# Patient Record
Sex: Male | Born: 2000 | Race: Black or African American | Hispanic: No | Marital: Single | Smoking: Never smoker
Health system: Southern US, Community
[De-identification: ages and names within clinical notes are randomized; demographics above are authoritative.]

## PROBLEM LIST (undated history)

## (undated) DIAGNOSIS — R569 Unspecified convulsions: Secondary | ICD-10-CM

---
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
## 2018-03-08 ENCOUNTER — Emergency Department (HOSPITAL_COMMUNITY)
Admission: EM | Admit: 2018-03-08 | Discharge: 2018-03-08 | Disposition: A | Discharge status: Home / Self Care | Payer: Medicaid Other | Attending: Emergency Medicine | Admitting: Emergency Medicine

## 2018-03-08 ENCOUNTER — Other Ambulatory Visit: Payer: Self-pay

## 2018-03-08 ENCOUNTER — Encounter (HOSPITAL_COMMUNITY): Payer: Self-pay

## 2018-03-08 DIAGNOSIS — G43009 Migraine without aura, not intractable, without status migrainosus: Secondary | ICD-10-CM

## 2018-03-08 MED ORDER — SUMATRIPTAN SUCCINATE 25 MG PO TABS: 25.0000 mg | ORAL_TABLET | ORAL | 0 refills | Status: AC | PRN

## 2018-03-08 MED ORDER — IBUPROFEN 800 MG PO TABS
800.0000 mg | ORAL_TABLET | Freq: Once | ORAL | Status: AC
  Administered 2018-03-08: 800 mg via ORAL
  Filled 2018-03-08: qty 1

## 2018-03-08 MED ORDER — METOCLOPRAMIDE HCL 5 MG/ML IJ SOLN
10.0000 mg | Freq: Once | INTRAMUSCULAR | Status: AC
  Administered 2018-03-08: 10 mg via INTRAVENOUS
  Filled 2018-03-08: qty 2

## 2018-03-08 MED ORDER — SODIUM CHLORIDE 0.9 % IV BOLUS
1000.0000 mL | Freq: Once | INTRAVENOUS | Status: AC
  Administered 2018-03-08: 1000 mL via INTRAVENOUS

## 2018-03-08 MED ORDER — DIPHENHYDRAMINE HCL 50 MG/ML IJ SOLN
25.0000 mg | Freq: Once | INTRAMUSCULAR | Status: AC
  Administered 2018-03-08: 25 mg via INTRAVENOUS
  Filled 2018-03-08: qty 1

## 2018-03-08 NOTE — ED Provider Notes (Signed)
[facility]-EMERGENCY DEPT Provider Note  CSN: [id number] Arrival date & time: 03/08/18  1739  History   Chief Complaint Chief Complaint  Patient presents with  . Headache  . Nausea    HPI Casey Kane is a 17 y.o. male with no significant medical history who presented to the ED for headache x2 days. He describes constant, pounding pain that he feels all over his head. Associated symptoms: blurred vision, photophobia and phonophobia. Denies fever, neck pain/stiffness, AMS, facial droop, slurred speech or weakness. Denies recent head trauma, falls or injuries. Patient reports that his mother has a history of migraine headaches. Patient has tried ibuprofen prior to coming to the ED.  History reviewed. No pertinent past medical history.  There are no active problems to display for this patient.   History reviewed. No pertinent surgical history.      Home Medications    Prior to Admission medications   Medication Sig Start Date End Date Taking? Authorizing Provider  doxycycline (VIBRAMYCIN) 100 MG capsule Take 1 capsule (100 mg total) by mouth 2 (two) times daily. One po bid x 7 days Patient not taking: Reported on 03/08/2018 11/27/17   Fayrene Helper, PA-C  ibuprofen (ADVIL,MOTRIN) 600 MG tablet Take 1 tablet (600 mg total) by mouth every 6 (six) hours as needed. Patient not taking: Reported on 03/08/2018 11/27/17   Fayrene Helper, PA-C  naproxen (NAPROSYN) 500 MG tablet Take 1 tablet (500 mg total) by mouth 2 (two) times daily as needed. Patient not taking: Reported on 03/08/2018 11/30/17   Alvino Chapel, [place], PA-C  SUMAtriptan (IMITREX) 25 MG tablet Take 1 tablet (25 mg total) by mouth every 2 (two) hours as needed for migraine. May repeat in 2 hours if headache persists or recurs. 03/08/18   Yareth Kearse, Sharyon Medicus, PA-C    Family History History reviewed. No pertinent family history.  Social History Social History   Tobacco Use  . Smoking status: Never Smoker  . Smokeless  tobacco: Never Used  Substance Use Topics  . Alcohol use: Never    Frequency: Never  . Drug use: Never     Allergies   Patient has no known allergies.   Review of Systems Review of Systems  Constitutional: Negative for chills and fever.  HENT: Negative.   Eyes: Positive for photophobia and visual disturbance.  Gastrointestinal: Negative for nausea and vomiting.  Musculoskeletal: Negative for neck pain and neck stiffness.  Neurological: Positive for dizziness and headaches. Negative for facial asymmetry, speech difficulty, weakness, light-headedness and numbness.  Psychiatric/Behavioral: Negative for confusion and decreased concentration.   Physical Exam Updated Vital Signs BP 134/65 (BP Location: Left Arm)   Pulse 68   Temp 98.3 F (36.8 C) (Oral)   Resp 14   SpO2 97%   Physical Exam  Constitutional: He is oriented to person, place, and time. Vital signs are normal. He appears well-developed and well-nourished. He is cooperative.  HENT:  Head: Normocephalic and atraumatic.  Eyes: Pupils are equal, round, and reactive to light. EOM are normal.  Neck: Full passive range of motion without pain. Neck supple. No spinous process tenderness and no muscular tenderness present. Normal range of motion present.  Cardiovascular: Normal rate, regular rhythm, normal heart sounds, intact distal pulses and normal pulses.  No murmur heard. Pulmonary/Chest: Effort normal and breath sounds normal.  Musculoskeletal: Normal range of motion.  Neurological: He is alert and oriented to person, place, and time. He has normal strength. No cranial nerve deficit or sensory deficit.  He exhibits normal muscle tone.  Psychiatric: He has a normal mood and affect. His speech is normal and behavior is normal. Thought content normal. He is attentive.  Nursing note and vitals reviewed.  ED Treatments / Results  Labs (all labs ordered are listed, but only abnormal results are displayed) Labs Reviewed - No  data to display  EKG None  Radiology No results found.  Procedures Procedures (including critical care time)  Medications Ordered in ED Medications  sodium chloride 0.9 % bolus 1,000 mL (1,000 mLs Intravenous New Bag/Given 03/08/18 2102)  diphenhydrAMINE (BENADRYL) injection 25 mg (25 mg Intravenous Given 03/08/18 2101)  metoCLOPramide (REGLAN) injection 10 mg (10 mg Intravenous Given 03/08/18 2101)  ibuprofen (ADVIL,MOTRIN) tablet 800 mg (800 mg Oral Given 03/08/18 2054)    Initial Impression / Assessment and Plan / ED Course  Triage vital signs and the nursing notes have been reviewed.  Pertinent labs & imaging results that were available during care of the patient were reviewed and considered in medical decision making (see chart for details).  Patient presents with a 2-day history of headache that is accompanied by blurred vision, photophobia, phonophobia and dizziness. Physical exam is reassuring as patient does not have any focal neuro deficits and is mentating well. Denies history of head trauma, syncope or AMS that would warrant head imaging. History consistent migraine pattern. Will initiate acute treatment with IV migraine cocktail.  Clinical Course as of Mar 08 2201  Wynelle Link Mar 08, 2018  2044 IV migraine cocktail of Reglan + Benadryl + NS given along with Ibuprofen 800mg  x1. Patient reports adverse reaction to Toradol in the past.   [GM]  2155 Patient re-evaluated and reports that headache has completely resolved.   [GM]    Clinical Course User Index [GM] Hadlea Furuya, Sharyon Medicus, PA-C   Final Clinical Impressions(s) / ED Diagnoses  1. Headache. Migraine type pattern. Relief achieved with migraine cocktail. Education provided on OTC and supportive treatment for migraines. Rx for Imitrex given if conservative and OTC treatments do not work. Advised to follow-up with PCP to discuss preventative medications if headaches become more frequent and/or severe.  Dispo: Home. After thorough  clinical evaluation, this patient is determined to be medically stable and can be safely discharged with the previously mentioned treatment and/or outpatient follow-up/referral(s). At this time, there are no other apparent medical conditions that require further screening, evaluation or treatment.  Final diagnoses:  Migraine without aura and without status migrainosus, not intractable    ED Discharge Orders         Ordered    SUMAtriptan (IMITREX) 25 MG tablet  Every 2 hours PRN     03/08/18 2150            Reva Bores 03/08/18 2202    Mancel Bale, MD 03/09/18 [phone]

## 2018-03-08 NOTE — ED Triage Notes (Signed)
Pt reports headache and nausea x 2-3 days. Reports that he also feels like his heart is racing and some slight SOB. A&Ox4. Ambulatory. PERRLA.

## 2018-03-08 NOTE — ED Notes (Signed)
Pt is alert and oriented x 4 and is verbally responsive. Pt is escorted by his girlfriends mother. Pt reports nausea, vomiting, and HA x 2-3 days.

## 2018-03-08 NOTE — ED Notes (Signed)
Patient ate sandwich and had water; patient tolerated well and does not feel nauseated at this time.

## 2018-03-08 NOTE — ED Notes (Signed)
Patient given sandwich.  

## 2018-03-08 NOTE — Discharge Instructions (Addendum)
For future headaches like this, you can try Tylenol (650mg ) and/or Ibuprofen (800mg ).   If that does not work, you can try the Sumatriptan (Imitrex) which I have written you a prescription for. You take one dose of the Imitrex and if you still have a headache after 2 hours, you take another dose. So that is no more than 2 doses in 1 day.  Follow-up with your PCP if these headaches become more frequent (more than 3-4 days a week for 1-2 months) or severe to discuss the need for preventive medications.  Thank you for allowing me to take care of you today!

## 2019-02-22 ENCOUNTER — Emergency Department (HOSPITAL_COMMUNITY)
Admission: EM | Admit: 2019-02-22 | Discharge: 2019-02-23 | Disposition: A | Discharge status: Home / Self Care | Payer: Medicaid Other | Attending: Emergency Medicine | Admitting: Emergency Medicine

## 2019-02-22 ENCOUNTER — Other Ambulatory Visit: Payer: Self-pay

## 2019-02-22 ENCOUNTER — Emergency Department (HOSPITAL_COMMUNITY): Payer: Medicaid Other

## 2019-02-22 ENCOUNTER — Encounter (HOSPITAL_COMMUNITY): Payer: Self-pay | Admitting: Emergency Medicine

## 2019-02-22 DIAGNOSIS — R509 Fever, unspecified: Secondary | ICD-10-CM | POA: Diagnosis not present

## 2019-02-22 DIAGNOSIS — R000 Tachycardia, unspecified: Secondary | ICD-10-CM | POA: Diagnosis not present

## 2019-02-22 DIAGNOSIS — M25462 Effusion, left knee: Secondary | ICD-10-CM | POA: Insufficient documentation

## 2019-02-22 DIAGNOSIS — M25562 Pain in left knee: Secondary | ICD-10-CM

## 2019-02-22 NOTE — ED Notes (Signed)
Pt ambulatory with unsteady gait due to pain. Pt provided with wheel chair to room 19 and to x-ray

## 2019-02-22 NOTE — ED Triage Notes (Signed)
Patient here from home with complaints of left knee pain x3 day. Denies injury. Increased with movement.

## 2019-02-23 ENCOUNTER — Other Ambulatory Visit: Payer: Self-pay

## 2019-02-23 LAB — CBC WITH DIFFERENTIAL/PLATELET
Abs Immature Granulocytes: 0.03 10*3/uL (ref 0.00–0.07)
Basophils Absolute: 0 10*3/uL (ref 0.0–0.1)
Basophils Relative: 0 %
Eosinophils Absolute: 0.1 10*3/uL (ref 0.0–0.5)
Eosinophils Relative: 1 %
HCT: 49.9 % (ref 39.0–52.0)
Hemoglobin: 16 g/dL (ref 13.0–17.0)
Immature Granulocytes: 0 %
Lymphocytes Relative: 29 %
Lymphs Abs: 2.4 10*3/uL (ref 0.7–4.0)
MCH: 27 pg (ref 26.0–34.0)
MCHC: 32.1 g/dL (ref 30.0–36.0)
MCV: 84.1 fL (ref 80.0–100.0)
Monocytes Absolute: 0.5 10*3/uL (ref 0.1–1.0)
Monocytes Relative: 5 %
Neutro Abs: 5.3 10*3/uL (ref 1.7–7.7)
Neutrophils Relative %: 65 %
Platelets: 222 10*3/uL (ref 150–400)
RBC: 5.93 MIL/uL — ABNORMAL HIGH (ref 4.22–5.81)
RDW: 13.1 % (ref 11.5–15.5)
WBC: 8.3 10*3/uL (ref 4.0–10.5)
nRBC: 0 % (ref 0.0–0.2)

## 2019-02-23 LAB — COMPREHENSIVE METABOLIC PANEL
ALT: 25 U/L (ref 0–44)
AST: 35 U/L (ref 15–41)
Albumin: 4.6 g/dL (ref 3.5–5.0)
Alkaline Phosphatase: 55 U/L (ref 38–126)
Anion gap: 15 (ref 5–15)
BUN: 9 mg/dL (ref 6–20)
CO2: 20 mmol/L — ABNORMAL LOW (ref 22–32)
Calcium: 9.6 mg/dL (ref 8.9–10.3)
Chloride: 103 mmol/L (ref 98–111)
Creatinine, Ser: 1.25 mg/dL — ABNORMAL HIGH (ref 0.61–1.24)
GFR calc Af Amer: >60 mL/min (ref >60)
GFR calc non Af Amer: >60 mL/min (ref >60)
Glucose, Bld: 87 mg/dL (ref 70–99)
Potassium: 4.7 mmol/L (ref 3.5–5.1)
Sodium: 138 mmol/L (ref 135–145)
Total Bilirubin: 0.9 mg/dL (ref 0.3–1.2)
Total Protein: 8 g/dL (ref 6.5–8.1)

## 2019-02-23 LAB — URINALYSIS, ROUTINE W REFLEX MICROSCOPIC
Bilirubin Urine: NEGATIVE
Glucose, UA: NEGATIVE mg/dL
Hgb urine dipstick: NEGATIVE
Ketones, ur: 5 mg/dL — AB
Leukocytes,Ua: NEGATIVE
Nitrite: NEGATIVE
Protein, ur: NEGATIVE mg/dL
Specific Gravity, Urine: 1.006 (ref 1.005–1.030)
pH: 6 (ref 5.0–8.0)

## 2019-02-23 LAB — TSH: TSH: 2.502 u[IU]/mL (ref 0.350–4.500)

## 2019-02-23 LAB — C-REACTIVE PROTEIN: CRP: 1.3 mg/dL — ABNORMAL HIGH (ref <1.0)

## 2019-02-23 LAB — SEDIMENTATION RATE: Sed Rate: 0 mm/hr (ref 0–16)

## 2019-02-23 MED ORDER — DIAZEPAM 5 MG PO TABS
5.0000 mg | ORAL_TABLET | Freq: Once | ORAL | Status: AC
  Administered 2019-02-23: 5 mg via ORAL
  Filled 2019-02-23: qty 1

## 2019-02-23 MED ORDER — SODIUM CHLORIDE 0.9 % IV BOLUS
1000.0000 mL | Freq: Once | INTRAVENOUS | Status: AC
Start: 2019-02-23 — End: 2019-02-23
  Administered 2019-02-23: 02:00:00 1000 mL via INTRAVENOUS

## 2019-02-23 MED ORDER — LIDOCAINE-EPINEPHRINE (PF) 2 %-1:200000 IJ SOLN
10.0000 mL | Freq: Once | INTRAMUSCULAR | Status: AC
  Administered 2019-02-23: 10 mL via INTRADERMAL
  Filled 2019-02-23: qty 10

## 2019-02-23 MED ORDER — ACETAMINOPHEN 500 MG PO TABS
1000.0000 mg | ORAL_TABLET | Freq: Once | ORAL | Status: AC
  Administered 2019-02-23: 1000 mg via ORAL
  Filled 2019-02-23: qty 2

## 2019-02-23 MED ORDER — OXYCODONE HCL 5 MG PO TABS
5.0000 mg | ORAL_TABLET | Freq: Once | ORAL | Status: AC
  Administered 2019-02-23: 5 mg via ORAL
  Filled 2019-02-23: qty 1

## 2019-02-23 NOTE — Discharge Instructions (Signed)
As we discussed please return for worsening pain or redness and swelling to the knee.  Please try and keep your weight off of your leg for the next week.  Take 4 over the counter ibuprofen tablets 3 times a day or 2 over-the-counter naproxen tablets twice a day for pain. Also take tylenol 1000mg (2 extra strength) four times a day.

## 2019-02-23 NOTE — ED Provider Notes (Signed)
[facility] DEPT Provider Note   CSN: [id number] Arrival date & time: 02/22/19  2300    History   Chief Complaint Chief Complaint  Patient presents with  . Knee Pain    HPI Nosson Wender is a 18 y.o. male.     18 yo M with a chief complaint of left knee pain.  Going on for about 2 or 3 days.  No trauma.  No fevers or chills.  No cough or congestion no abdominal pain.  No urinary symptoms.  Denies IV drug abuse.  Denies wounds to the legs.  Pain is worse with sitting.  Some pain with ambulation.  The history is provided by the patient.  Knee Pain Location:  Knee Time since incident:  2 days Injury: no   Knee location:  L knee Pain details:    Quality:  Aching   Radiates to:  Does not radiate   Severity:  Moderate   Onset quality:  Gradual   Duration:  2 days   Timing:  Constant   Progression:  Worsening Chronicity:  New Prior injury to area:  No Relieved by:  Nothing Worsened by:  Nothing Ineffective treatments:  None tried Associated symptoms: no fever     History reviewed. No pertinent past medical history.  There are no active problems to display for this patient.   History reviewed. No pertinent surgical history.      Home Medications    Prior to Admission medications   Medication Sig Start Date End Date Taking? Authorizing Provider  doxycycline (VIBRAMYCIN) 100 MG capsule Take 1 capsule (100 mg total) by mouth 2 (two) times daily. One po bid x 7 days Patient not taking: Reported on 03/08/2018 11/27/17   Domenic Moras, PA-C  ibuprofen (ADVIL,MOTRIN) 600 MG tablet Take 1 tablet (600 mg total) by mouth every 6 (six) hours as needed. Patient not taking: Reported on 03/08/2018 11/27/17   Domenic Moras, PA-C  naproxen (NAPROSYN) 500 MG tablet Take 1 tablet (500 mg total) by mouth 2 (two) times daily as needed. Patient not taking: Reported on 03/08/2018 11/30/17   Stacey Drain, [place], PA-C  SUMAtriptan (IMITREX) 25 MG tablet Take 1 tablet (25  mg total) by mouth every 2 (two) hours as needed for migraine. May repeat in 2 hours if headache persists or recurs. Patient not taking: Reported on 02/23/2019 03/08/18   Romie Jumper, PA-C    Family History No family history on file.  Social History Social History   Tobacco Use  . Smoking status: Never Smoker  . Smokeless tobacco: Never Used  Substance Use Topics  . Alcohol use: Never    Frequency: Never  . Drug use: Never     Allergies   Patient has no known allergies.   Review of Systems Review of Systems  Constitutional: Negative for chills and fever.  HENT: Negative for congestion and facial swelling.   Eyes: Negative for discharge and visual disturbance.  Respiratory: Negative for shortness of breath.   Cardiovascular: Negative for chest pain and palpitations.  Gastrointestinal: Negative for abdominal pain, diarrhea and vomiting.  Musculoskeletal: Positive for arthralgias. Negative for myalgias.  Skin: Negative for color change and rash.  Neurological: Negative for tremors, syncope and headaches.  Psychiatric/Behavioral: Negative for confusion and dysphoric mood.     Physical Exam Updated Vital Signs BP (!) 127/56   Pulse 96   Temp 98.4 F (36.9 C) (Oral)   Resp 16   SpO2 93%   Physical Exam Vitals  signs and nursing note reviewed.  Constitutional:      Appearance: He is well-developed.  HENT:     Head: Normocephalic and atraumatic.  Eyes:     Pupils: Pupils are equal, round, and reactive to light.  Neck:     Musculoskeletal: Normal range of motion and neck supple.     Vascular: No JVD.  Cardiovascular:     Rate and Rhythm: Regular rhythm. Tachycardia present.     Heart sounds: No murmur. No friction rub. No gallop.   Pulmonary:     Effort: No respiratory distress.     Breath sounds: No wheezing.  Abdominal:     General: There is no distension.     Tenderness: There is no guarding or rebound.  Musculoskeletal: Normal range of motion.         General: Swelling present.     Comments: Very mild edema compared to the right knee.  Full range of motion without significant tenderness.  Skin:    Coloration: Skin is not pale.     Findings: No rash.  Neurological:     Mental Status: He is alert and oriented to person, place, and time.  Psychiatric:        Behavior: Behavior normal.      ED Treatments / Results  Labs (all labs ordered are listed, but only abnormal results are displayed) Labs Reviewed  COMPREHENSIVE METABOLIC PANEL - Abnormal; Notable for the following components:      Result Value   CO2 20 (*)    Creatinine, Ser 1.25 (*)    All other components within normal limits  C-REACTIVE PROTEIN - Abnormal; Notable for the following components:   CRP 1.3 (*)    All other components within normal limits  URINALYSIS, ROUTINE W REFLEX MICROSCOPIC - Abnormal; Notable for the following components:   Color, Urine STRAW (*)    Ketones, ur 5 (*)    All other components within normal limits  CBC WITH DIFFERENTIAL/PLATELET - Abnormal; Notable for the following components:   RBC 5.93 (*)    All other components within normal limits  SEDIMENTATION RATE  CBC WITH DIFFERENTIAL/PLATELET  TSH  T4  GC/CHLAMYDIA PROBE AMP ([place]) NOT AT [facility]    EKG None  Radiology Dg Knee Complete 4 Views Left  Result Date: 02/23/2019 CLINICAL DATA:  Knee pain EXAM: LEFT KNEE - COMPLETE 4+ VIEW COMPARISON:  None. FINDINGS: No evidence of fracture, dislocation, or joint effusion. No evidence of arthropathy or other focal bone abnormality. There is some mild fragmentation of the tibial tubercle which is likely chronic. There appears to be some adjacent soft tissue swelling. IMPRESSION: 1. No acute displaced fracture or dislocation. 2. Mild soft tissue swelling about the tibial tubercle may represent sequela of Osgood-Schlatter disease, however the patient is slightly older than expected for this process. Electronically Signed   By: Katherine Mantlehristopher   Green M.D.   On: 02/23/2019 00:00    Procedures .Joint Aspiration/Arthrocentesis  Date/Time: 02/23/2019 1:23 AM Performed by: Melene PlanFloyd, Kiel Cockerell, DO Authorized by: Melene PlanFloyd, Saori Umholtz, DO   Consent:    Consent obtained:  Verbal   Consent given by:  Patient   Risks discussed:  Bleeding, infection and incomplete drainage   Alternatives discussed:  No treatment, delayed treatment, alternative treatment and observation Location:    Location:  Knee   Knee:  L knee Anesthesia (see MAR for exact dosages):    Anesthesia method:  Local infiltration   Local anesthetic:  Lidocaine 2% WITH epi Procedure  details:    Preparation: Patient was prepped and draped in usual sterile fashion     Needle gauge: 21.   Ultrasound guidance: no     Approach:  Lateral   Aspirate amount:  1cc   Aspirate characteristics:  Clear   Steroid injected: no     Specimen collected: no   Post-procedure details:    Dressing:  Adhesive bandage   Patient tolerance of procedure:  Tolerated well, no immediate complications   (including critical care time)  Medications Ordered in ED Medications  sodium chloride 0.9 % bolus 1,000 mL (0 mLs Intravenous Stopped 02/23/19 0412)  lidocaine-EPINEPHrine (XYLOCAINE W/EPI) 2 %-1:200000 (PF) injection 10 mL (10 mLs Intradermal Given by Other 02/23/19 0059)  acetaminophen (TYLENOL) tablet 1,000 mg (1,000 mg Oral Given 02/23/19 0059)  oxyCODONE (Oxy IR/ROXICODONE) immediate release tablet 5 mg (5 mg Oral Given 02/23/19 0059)  diazepam (VALIUM) tablet 5 mg (5 mg Oral Given 02/23/19 0058)     Initial Impression / Assessment and Plan / ED Course  I have reviewed the triage vital signs and the nursing notes.  Pertinent labs & imaging results that were available during my care of the patient were reviewed by me and considered in my medical decision making (see chart for details).        18 yo M with a chief complaints of left knee pain.  Patient was found to nearly febrile here with a  temperature of 100.1.  His heart rate is going into the 150s pretty consistently.  He denies any infectious symptoms.  Only complaining of left knee pain.  The knee clinically is not consistent with septic arthritis.  He has full range of motion with very minimal tenderness.  With a borderline fever and significant tachycardia I must still consider it as a possible diagnosis.  Arthrocentesis however with a essentially dry tap.  I was able to get a couple drops of fluid.  Clear appearing.  I felt that this was reassuring and consistent with not being septic arthritis.  Patient's heart rate still going into the 130s.  Given pain medicine fluids will check lab work.  The patient's electrolytes are unremarkable.  Hemoglobin is normal.  Sed rate is normal.  His CRP is mildly elevated.  X-ray viewed by me without fracture.  I called the lab is the patient's thyroid test has not yet resulted.  They felt that it needed to be rerun in the system.  I discussed this with the patient who is like to be discharged at this time.  We will place him in a knee immobilizer.  PCP follow-up.  4:22 AM:  I have discussed the diagnosis/risks/treatment options with the patient and believe the pt to be eligible for discharge home to follow-up with PCP. We also discussed returning to the ED immediately if new or worsening sx occur. We discussed the sx which are most concerning (e.g., sudden worsening pain, fever, inability to tolerate by mouth) that necessitate immediate return. Medications administered to the patient during their visit and any new prescriptions provided to the patient are listed below.  Medications given during this visit Medications  sodium chloride 0.9 % bolus 1,000 mL (0 mLs Intravenous Stopped 02/23/19 0412)  lidocaine-EPINEPHrine (XYLOCAINE W/EPI) 2 %-1:200000 (PF) injection 10 mL (10 mLs Intradermal Given by Other 02/23/19 0059)  acetaminophen (TYLENOL) tablet 1,000 mg (1,000 mg Oral Given 02/23/19 0059)   oxyCODONE (Oxy IR/ROXICODONE) immediate release tablet 5 mg (5 mg Oral Given 02/23/19 0059)  diazepam (VALIUM)  tablet 5 mg (5 mg Oral Given 02/23/19 0058)     The patient appears reasonably screen and/or stabilized for discharge and I doubt any other medical condition or other [facility] requiring further screening, evaluation, or treatment in the ED at this time prior to discharge.   Final Clinical Impressions(s) / ED Diagnoses   Final diagnoses:  Acute pain of left knee  Sinus tachycardia    ED Discharge Orders    None       Melene PlanFloyd, Chianne Byrns, DO 02/23/19 0422

## 2019-02-24 LAB — T4: T4, Total: 6.3 ug/dL (ref 4.5–12.0)

## 2019-05-13 ENCOUNTER — Encounter (HOSPITAL_COMMUNITY): Payer: Self-pay

## 2019-05-13 ENCOUNTER — Other Ambulatory Visit: Payer: Self-pay

## 2019-05-13 ENCOUNTER — Emergency Department (HOSPITAL_COMMUNITY)
Admission: EM | Admit: 2019-05-13 | Discharge: 2019-05-13 | Disposition: A | Discharge status: Home / Self Care | Payer: Medicaid Other | Attending: Emergency Medicine | Admitting: Emergency Medicine

## 2019-05-13 DIAGNOSIS — R251 Tremor, unspecified: Secondary | ICD-10-CM | POA: Diagnosis present

## 2019-05-13 DIAGNOSIS — I10 Essential (primary) hypertension: Secondary | ICD-10-CM | POA: Insufficient documentation

## 2019-05-13 HISTORY — DX: Unspecified convulsions: R56.9

## 2019-05-13 LAB — BASIC METABOLIC PANEL
Anion gap: 9 (ref 5–15)
BUN: 13 mg/dL (ref 6–20)
CO2: 25 mmol/L (ref 22–32)
Calcium: 9.4 mg/dL (ref 8.9–10.3)
Chloride: 102 mmol/L (ref 98–111)
Creatinine, Ser: 1.19 mg/dL (ref 0.61–1.24)
GFR calc Af Amer: >60 mL/min (ref >60)
GFR calc non Af Amer: >60 mL/min (ref >60)
Glucose, Bld: 127 mg/dL — ABNORMAL HIGH (ref 70–99)
Potassium: 4 mmol/L (ref 3.5–5.1)
Sodium: 136 mmol/L (ref 135–145)

## 2019-05-13 LAB — CBC WITH DIFFERENTIAL/PLATELET
Abs Immature Granulocytes: 0.02 10*3/uL (ref 0.00–0.07)
Basophils Absolute: 0 10*3/uL (ref 0.0–0.1)
Basophils Relative: 0 %
Eosinophils Absolute: 0.1 10*3/uL (ref 0.0–0.5)
Eosinophils Relative: 1 %
HCT: 51.4 % (ref 39.0–52.0)
Hemoglobin: 16.2 g/dL (ref 13.0–17.0)
Immature Granulocytes: 0 %
Lymphocytes Relative: 30 %
Lymphs Abs: 2.2 10*3/uL (ref 0.7–4.0)
MCH: 26.6 pg (ref 26.0–34.0)
MCHC: 31.5 g/dL (ref 30.0–36.0)
MCV: 84.5 fL (ref 80.0–100.0)
Monocytes Absolute: 0.4 10*3/uL (ref 0.1–1.0)
Monocytes Relative: 6 %
Neutro Abs: 4.6 10*3/uL (ref 1.7–7.7)
Neutrophils Relative %: 63 %
Platelets: 197 10*3/uL (ref 150–400)
RBC: 6.08 MIL/uL — ABNORMAL HIGH (ref 4.22–5.81)
RDW: 13.4 % (ref 11.5–15.5)
WBC: 7.3 10*3/uL (ref 4.0–10.5)
nRBC: 0 % (ref 0.0–0.2)

## 2019-05-13 LAB — ETHANOL: Alcohol, Ethyl (B): <10 mg/dL (ref <10)

## 2019-05-13 NOTE — ED Triage Notes (Signed)
Pt BIB [facility] from home. Witness states that he was sitting on the cough and started shaking for 30 seconds. Patient states that he remembers the whole event and does not think that he had a seizure, but does have a recent hx of a seizure for which he was medicated for a short period and then the meds were d/c'd according to patient. A&Ox4 now.   20g L hand

## 2019-05-13 NOTE — Discharge Instructions (Addendum)
The testing today did not show any serious problems.  Your blood pressure is minimally elevated.  You may have sleep apnea, causing your breathing problems at nighttime.  It is unclear if you have had a seizure or not.  We are referring you to the neurology service to be evaluated for possible seizure disorder.  Make sure you are getting plenty of rest, avoid alcohol before sleep, try to exercise regularly and drink a lot of fluids, especially water.  Return here if needed.

## 2019-05-13 NOTE — ED Provider Notes (Signed)
[facility]-EMERGENCY DEPT Provider Note   CSN: [id number] Arrival date & time: 05/13/19  1943     History   Chief Complaint Chief Complaint  Patient presents with  . Seizures    HPI Casey Kane is a 18 y.o. male.     HPI   Patient was sleeping, watching TV, awoke and had 5-second episode of twitching arms and jaw.  This resolved spontaneously.  He has a history of event felt to be seizure about 5 years ago.  EMS was called they came to his home and found that his blood pressure was high at 170/100.  He was therefore transferred here for evaluation of these problems.  He did not work today, was at home with his partner, and they were doing things around the house.  She states that he has episodes of catching his breath, and waking up while sleeping, for long time and that he snores a lot.  She thinks that he has sleep apnea.  He has never been formally evaluated for seizure disorder.  He denies recent illnesses including fever, chills, vomiting, dizziness, change in bowel or urinary habits.  There are no other known modifying factors.  Past Medical History:  Diagnosis Date  . Seizures (HCC)     There are no active problems to display for this patient.   History reviewed. No pertinent surgical history.      Home Medications    Prior to Admission medications   Medication Sig Start Date End Date Taking? Authorizing Provider  acetaminophen (TYLENOL) 500 MG tablet Take 1,000 mg by mouth every 6 (six) hours as needed for mild pain.   Yes [provider]  doxycycline (VIBRAMYCIN) 100 MG capsule Take 1 capsule (100 mg total) by mouth 2 (two) times daily. One po bid x 7 days Patient not taking: Reported on 03/08/2018 11/27/17   Fayrene Helperran, [place], PA-C  ibuprofen (ADVIL,MOTRIN) 600 MG tablet Take 1 tablet (600 mg total) by mouth every 6 (six) hours as needed. Patient not taking: Reported on 03/08/2018 11/27/17   Fayrene Helperran, [place], PA-C  naproxen (NAPROSYN) 500 MG  tablet Take 1 tablet (500 mg total) by mouth 2 (two) times daily as needed. Patient not taking: Reported on 03/08/2018 11/30/17   Alvino ChapelWurst, [place], PA-C  SUMAtriptan (IMITREX) 25 MG tablet Take 1 tablet (25 mg total) by mouth every 2 (two) hours as needed for migraine. May repeat in 2 hours if headache persists or recurs. Patient not taking: Reported on 02/23/2019 03/08/18   Windy CarinaMortis, Gabrielle I, PA-C    Family History History reviewed. No pertinent family history.  Social History Social History   Tobacco Use  . Smoking status: Never Smoker  . Smokeless tobacco: Never Used  Substance Use Topics  . Alcohol use: Never    Frequency: Never  . Drug use: Never     Allergies   Patient has no known allergies.   Review of Systems Review of Systems  All other systems reviewed and are negative.    Physical Exam Updated Vital Signs BP 116/89 (BP Location: Left Arm)   Pulse 92   Temp 98.5 F (36.9 C) (Oral)   Resp 18   Ht 5\' 10"  (1.778 m)   Wt 104.3 kg   SpO2 93%   BMI 33.00 kg/m   Physical Exam Vitals signs and nursing note reviewed.  Constitutional:      General: He is not in acute distress.    Appearance: Normal appearance. He is well-developed. He is  not ill-appearing, toxic-appearing or diaphoretic.  HENT:     Head: Normocephalic and atraumatic.     Right Ear: External ear normal.     Left Ear: External ear normal.     Mouth/Throat:     Mouth: Mucous membranes are moist.  Eyes:     Conjunctiva/sclera: Conjunctivae normal.     Pupils: Pupils are equal, round, and reactive to light.  Neck:     Musculoskeletal: Normal range of motion and neck supple.     Trachea: Phonation normal.  Cardiovascular:     Rate and Rhythm: Normal rate and regular rhythm.     Heart sounds: Normal heart sounds.  Pulmonary:     Effort: Pulmonary effort is normal.     Breath sounds: Normal breath sounds.  Abdominal:     General: There is no distension.     Palpations: Abdomen is soft.      Tenderness: There is no abdominal tenderness.  Musculoskeletal: Normal range of motion.  Skin:    General: Skin is warm and dry.  Neurological:     Mental Status: He is alert and oriented to person, place, and time.     Cranial Nerves: No cranial nerve deficit.     Sensory: No sensory deficit.     Motor: No abnormal muscle tone.     Coordination: Coordination normal.     Comments: No Dysarthria, or aphasia.  Psychiatric:        Mood and Affect: Mood normal.        Behavior: Behavior normal.        Thought Content: Thought content normal.        Judgment: Judgment normal.      ED Treatments / Results  Labs (all labs ordered are listed, but only abnormal results are displayed) Labs Reviewed  BASIC METABOLIC PANEL - Abnormal; Notable for the following components:      Result Value   Glucose, Bld 127 (*)    All other components within normal limits  CBC WITH DIFFERENTIAL/PLATELET - Abnormal; Notable for the following components:   RBC 6.08 (*)    All other components within normal limits  ETHANOL    EKG None  Radiology No results found.  Procedures Procedures (including critical care time)  Medications Ordered in ED Medications - No data to display   Initial Impression / Assessment and Plan / ED Course  I have reviewed the triage vital signs and the nursing notes.  Pertinent labs & imaging results that were available during my care of the patient were reviewed by me and considered in my medical decision making (see chart for details).  Clinical Course as of May 12 2328  Thu May 13, 2019  2322 Normal  Ethanol [EW]  2322 Normal  CBC with Differential(!) [EW]  2322 Normal  Basic metabolic panel(!) [EW]    Clinical Course User Index [EW] Mancel Bale, MD        Patient Vitals for the past 24 hrs:  BP Temp Temp src Pulse Resp SpO2 Height Weight  05/13/19 2323 116/89 - - 92 18 93 % - -  05/13/19 2312 116/89 - - (!) 107 - 97 % - -  05/13/19 2245 - - - 91  - 94 % - -  05/13/19 2200 - - - (!) 109 - 95 % - -  05/13/19 2100 (!) 154/85 - - (!) 110 - 96 % - -  05/13/19 2051 131/80 - - (!) 113 16 93 % - -  05/13/19 1959 - - - - - - 5\' 10"  (1.778 m) 104.3 kg  05/13/19 1957 140/86 98.5 F (36.9 C) Oral (!) 115 16 96 % - -    11:23 PM Reevaluation with update and discussion. After initial assessment and treatment, an updated evaluation reveals he is comfortable has no further complaints.  There is been no episodes of shaking the ED.  Findings discussed with patient's male friend, and the patient.  All questions answered. Daleen Bo   Medical Decision Making: Nonspecific episode of shaking, resolved spontaneously.  Patient with clinical history consistent with sleep apnea.  Screening evaluation is reassuring today.  No indication for hospitalization at this time.  CRITICAL CARE-no Performed by: Daleen Bo   Nursing Notes Reviewed/ Care Coordinated Applicable Imaging Reviewed Interpretation of Laboratory Data incorporated into ED treatment  The patient appears reasonably screened and/or stabilized for discharge and I doubt any other medical condition or other [facility] requiring further screening, evaluation, or treatment in the ED at this time prior to discharge.  Plan: Home Medications-OTC as needed; Home Treatments-good sleep hygiene, avoid alcohol, eat and drink regularly; return here if the recommended treatment, does not improve the symptoms; Recommended follow up-follow-up with neurology for possible seizure work-up, and PCP for evaluation of blood pressure, and possible sleep apnea.    Final Clinical Impressions(s) / ED Diagnoses   Final diagnoses:  Episode of shaking    ED Discharge Orders    None       Daleen Bo, MD 05/13/19 2330

## 2021-03-15 IMAGING — CR LEFT KNEE - COMPLETE 4+ VIEW
4 series · 4 of 4 positions shown · non-contrast
Comparison: None.

CLINICAL DATA: Knee pain

EXAM:
LEFT KNEE - COMPLETE 4+ VIEW

[t knee obl left (1 of 2)]
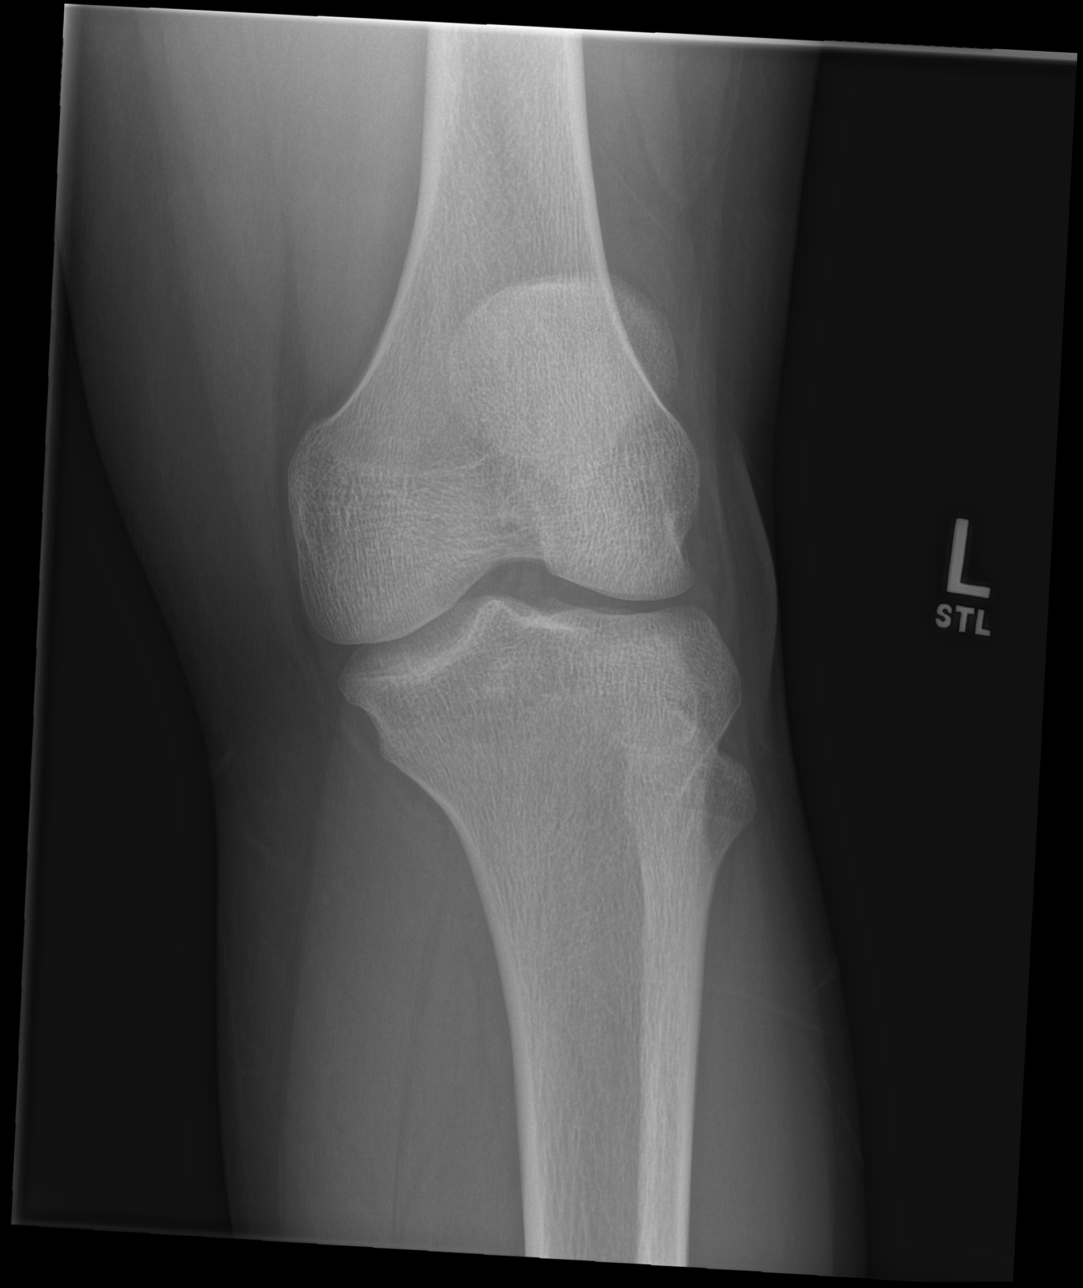

[t knee obl left (2 of 2)]
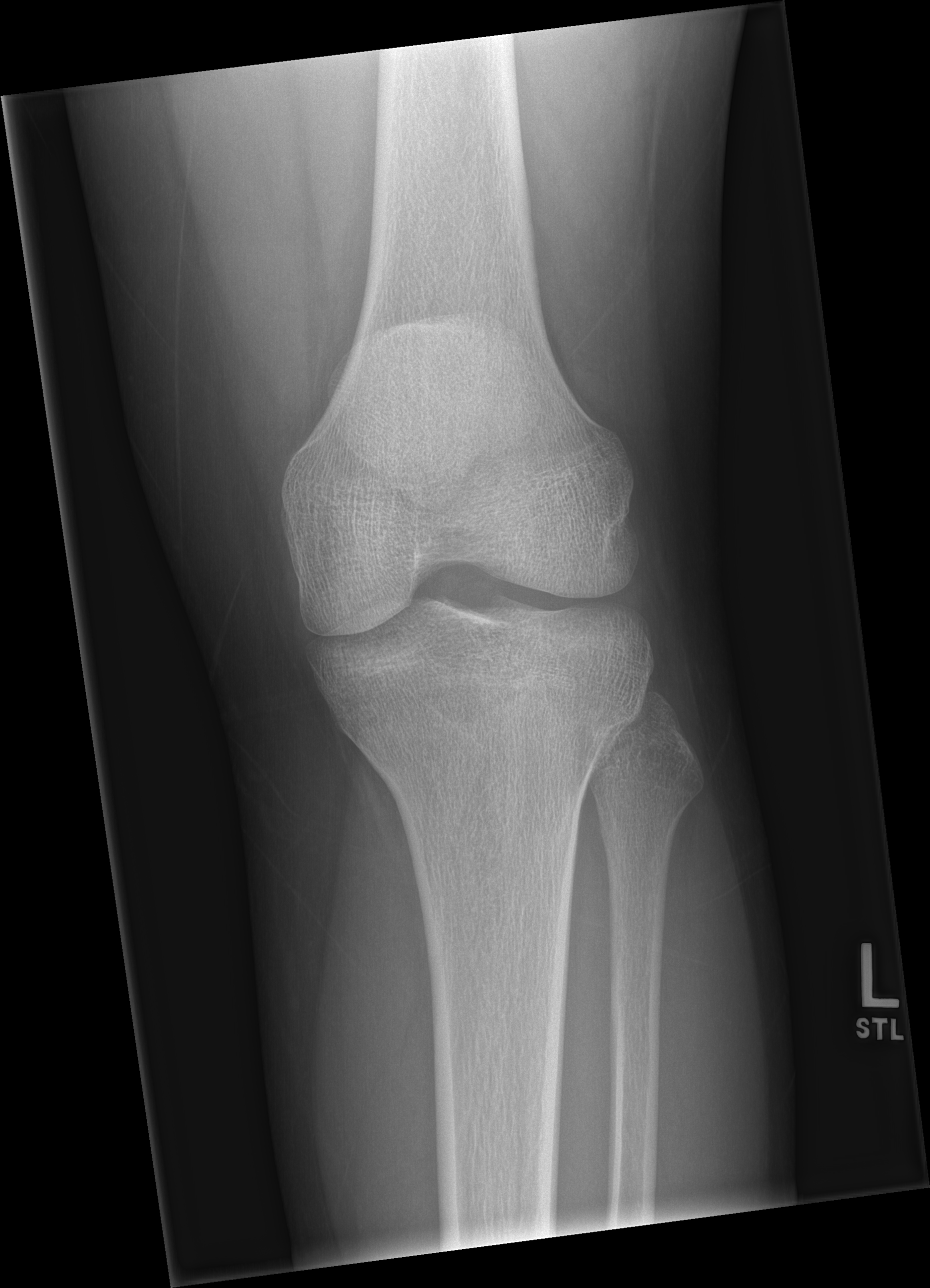

[t knee lat left (1 of 2)]
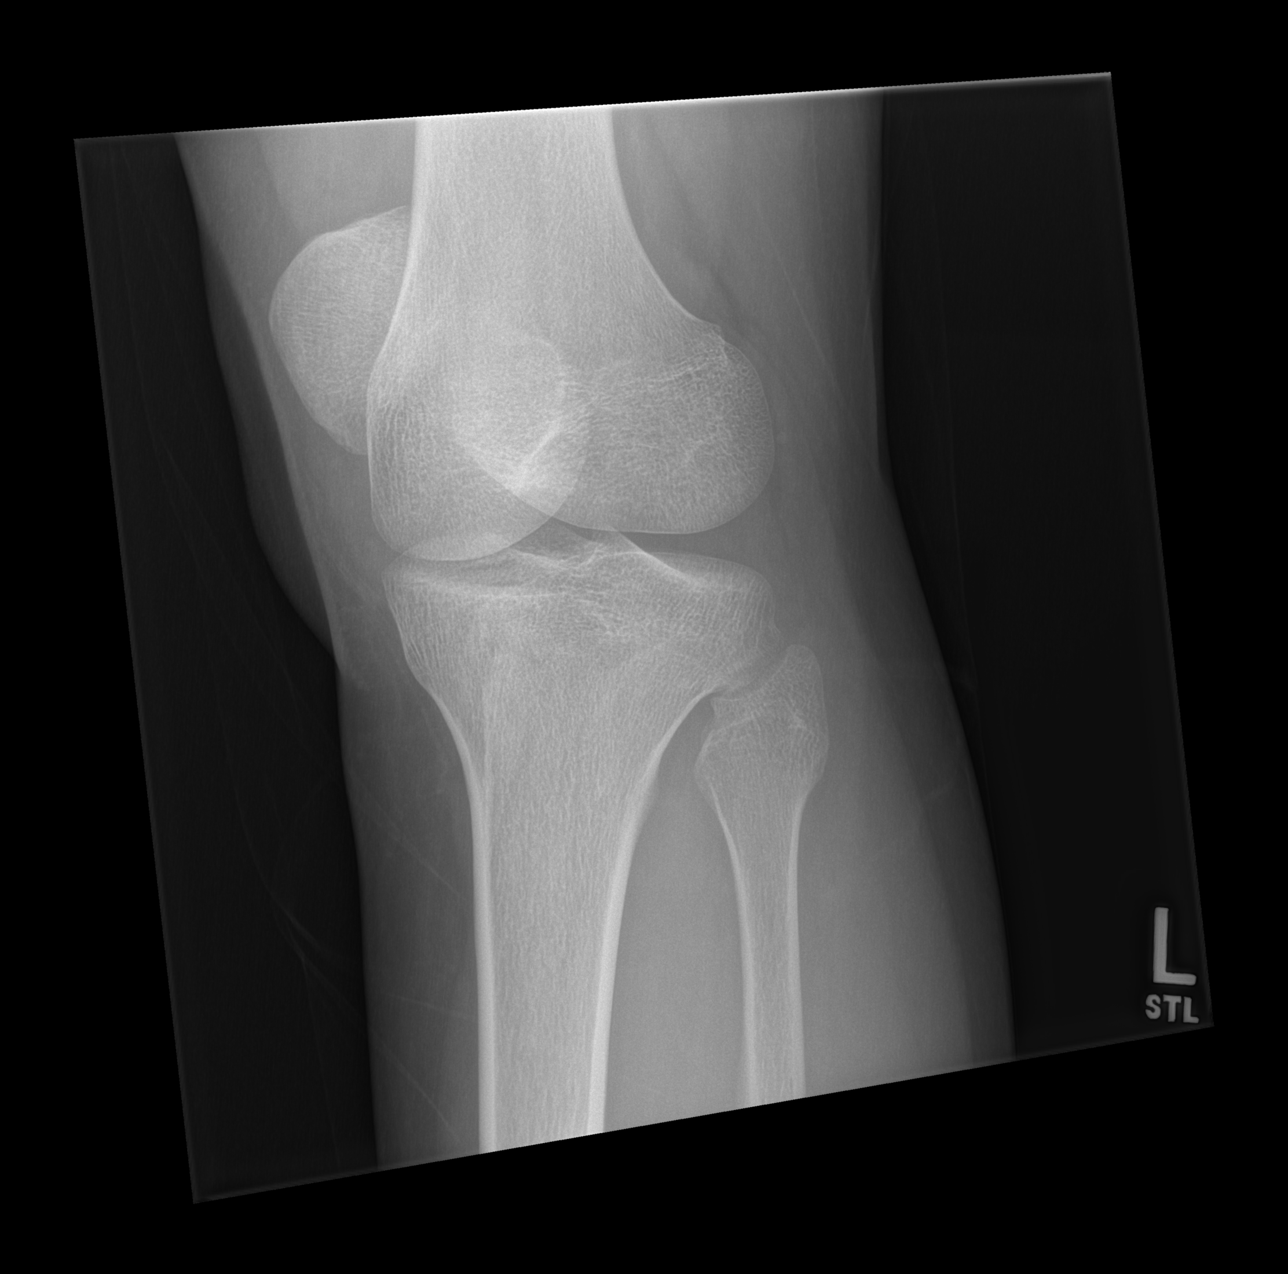

[t knee lat left (2 of 2)]
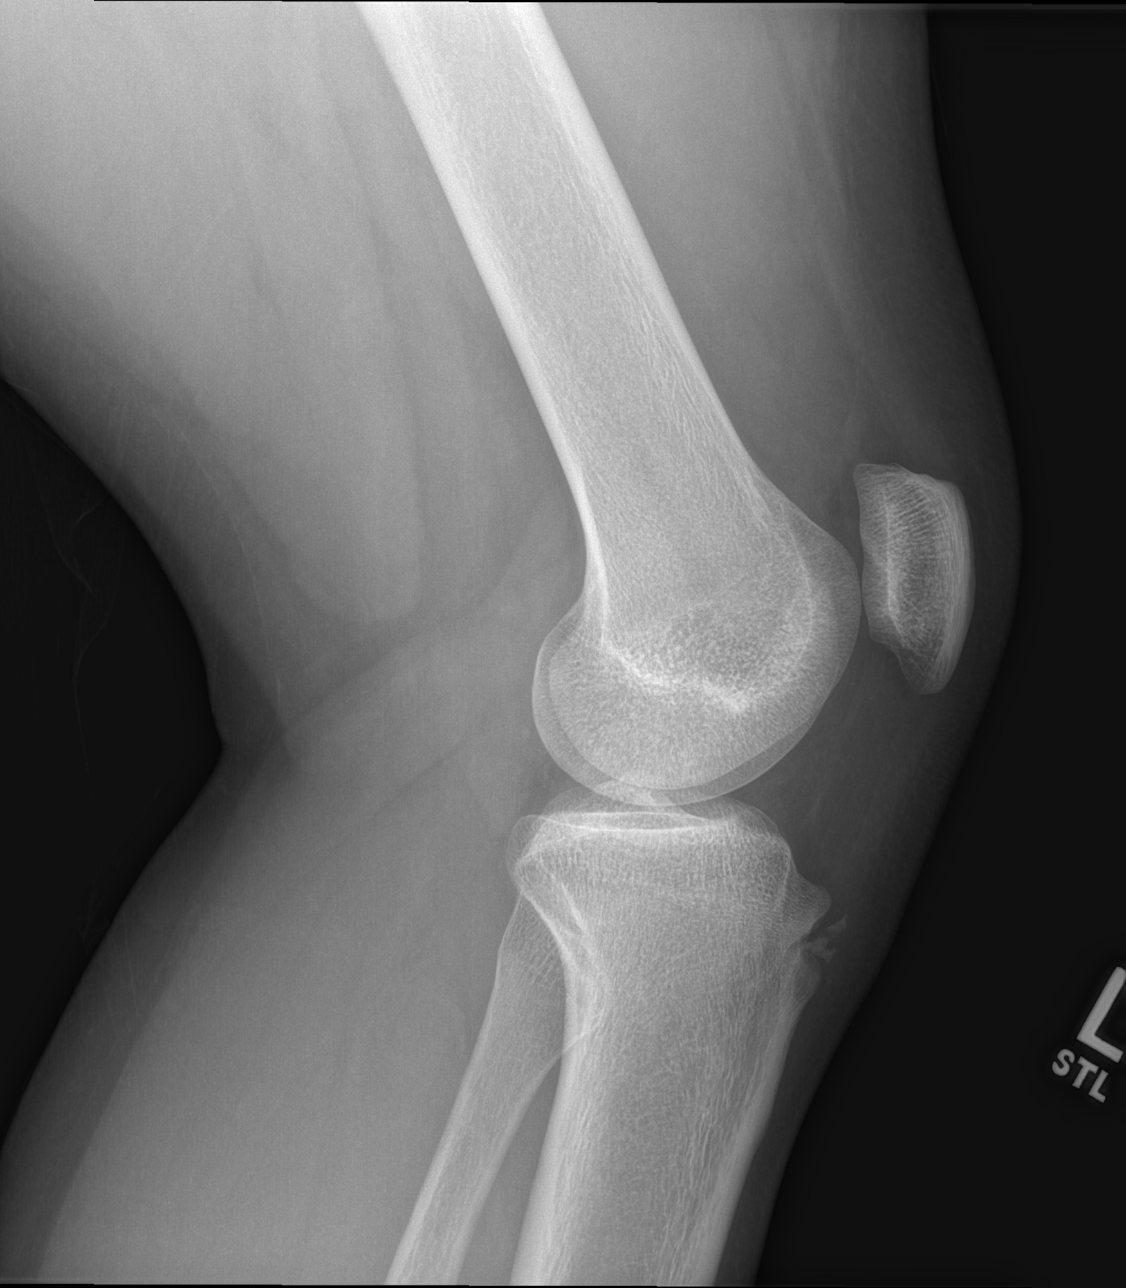

[4 of 4 positions shown; findings below may reference images not displayed]

FINDINGS: No evidence of fracture, dislocation, or joint effusion. No evidence
of arthropathy or other focal bone abnormality. There is some mild
fragmentation of the tibial tubercle which is likely chronic. There
appears to be some adjacent soft tissue swelling.
IMPRESSION: 1. No acute displaced fracture or dislocation.
2. Mild soft tissue swelling about the tibial tubercle may represent
sequela of Osgood-Schlatter disease, however the patient is slightly
older than expected for this process.
